# Patient Record
Sex: Male | Born: 1970 | Race: White | Hispanic: No | Marital: Married | Smoking: Current every day smoker
Health system: Southern US, Community
[De-identification: ages and names within clinical notes are randomized; demographics above are authoritative.]

## PROBLEM LIST (undated history)

## (undated) DIAGNOSIS — E119 Type 2 diabetes mellitus without complications: Secondary | ICD-10-CM

---

## 2013-08-03 ENCOUNTER — Emergency Department (HOSPITAL_COMMUNITY): Payer: Worker's Compensation

## 2013-08-03 ENCOUNTER — Emergency Department (HOSPITAL_COMMUNITY)
Admission: EM | Admit: 2013-08-03 | Discharge: 2013-08-03 | Disposition: A | Discharge status: Home / Self Care | Payer: Worker's Compensation | Attending: Emergency Medicine | Admitting: Emergency Medicine

## 2013-08-03 ENCOUNTER — Encounter (HOSPITAL_COMMUNITY): Payer: Self-pay | Admitting: Emergency Medicine

## 2013-08-03 DIAGNOSIS — S0100XA Unspecified open wound of scalp, initial encounter: Secondary | ICD-10-CM | POA: Diagnosis not present

## 2013-08-03 DIAGNOSIS — Y929 Unspecified place or not applicable: Secondary | ICD-10-CM | POA: Insufficient documentation

## 2013-08-03 DIAGNOSIS — S0003XA Contusion of scalp, initial encounter: Secondary | ICD-10-CM | POA: Insufficient documentation

## 2013-08-03 DIAGNOSIS — F17200 Nicotine dependence, unspecified, uncomplicated: Secondary | ICD-10-CM | POA: Diagnosis not present

## 2013-08-03 DIAGNOSIS — E119 Type 2 diabetes mellitus without complications: Secondary | ICD-10-CM | POA: Diagnosis not present

## 2013-08-03 DIAGNOSIS — W1809XA Striking against other object with subsequent fall, initial encounter: Secondary | ICD-10-CM | POA: Diagnosis not present

## 2013-08-03 DIAGNOSIS — S0093XA Contusion of unspecified part of head, initial encounter: Secondary | ICD-10-CM

## 2013-08-03 DIAGNOSIS — S1093XA Contusion of unspecified part of neck, initial encounter: Secondary | ICD-10-CM

## 2013-08-03 DIAGNOSIS — Z23 Encounter for immunization: Secondary | ICD-10-CM | POA: Diagnosis not present

## 2013-08-03 DIAGNOSIS — S0101XA Laceration without foreign body of scalp, initial encounter: Secondary | ICD-10-CM

## 2013-08-03 DIAGNOSIS — S0083XA Contusion of other part of head, initial encounter: Secondary | ICD-10-CM

## 2013-08-03 DIAGNOSIS — Y939 Activity, unspecified: Secondary | ICD-10-CM | POA: Diagnosis not present

## 2013-08-03 HISTORY — DX: Type 2 diabetes mellitus without complications: E11.9

## 2013-08-03 MED ORDER — IBUPROFEN 800 MG PO TABS
800.0000 mg | ORAL_TABLET | Freq: Three times a day (TID) | ORAL | Status: AC
Start: 2013-08-03 — End: ?

## 2013-08-03 MED ORDER — ACETAMINOPHEN 500 MG PO TABS
1000.0000 mg | ORAL_TABLET | Freq: Once | ORAL | Status: AC
  Administered 2013-08-03: 1000 mg via ORAL
  Filled 2013-08-03: qty 2

## 2013-08-03 MED ORDER — IBUPROFEN 800 MG PO TABS
800.0000 mg | ORAL_TABLET | Freq: Once | ORAL | Status: AC
  Administered 2013-08-03: 800 mg via ORAL
  Filled 2013-08-03: qty 1

## 2013-08-03 MED ORDER — TETANUS-DIPHTH-ACELL PERTUSSIS 5-2.5-18.5 LF-MCG/0.5 IM SUSP
0.5000 mL | Freq: Once | INTRAMUSCULAR | Status: AC
  Administered 2013-08-03: 0.5 mL via INTRAMUSCULAR

## 2013-08-03 MED ORDER — LIDOCAINE-EPINEPHRINE (PF) 2 %-1:200000 IJ SOLN
INTRAMUSCULAR | Status: AC
  Administered 2013-08-03: 21:00:00
  Filled 2013-08-03: qty 20

## 2013-08-03 MED ORDER — HYDROCODONE-ACETAMINOPHEN 5-325 MG PO TABS: 1.0000 | ORAL_TABLET | ORAL | Status: AC | PRN

## 2013-08-03 MED ORDER — TETANUS-DIPHTH-ACELL PERTUSSIS 5-2.5-18.5 LF-MCG/0.5 IM SUSP
INTRAMUSCULAR | Status: AC
  Filled 2013-08-03: qty 0.5

## 2013-08-03 NOTE — ED Provider Notes (Signed)
Medical screening examination/treatment/procedure(s) were performed by non-physician practitioner and as supervising physician I was immediately available for consultation/collaboration.  EKG Interpretation   None       Devoria AlbeIva Winnie Umali, MD, Armando GangFACEP   Ward GivensIva L Ciearra Rufo, MD 08/03/13 2037

## 2013-08-03 NOTE — Discharge Instructions (Signed)
Please keep your wound clean and dry. Please have your staples removed in 7 days. Please return sooneer  if any unusual nausea vomiting, headache, or changes in your mentation. Sutured Wound Care Sutures are stitches that can be used to close wounds. Wound care helps prevent pain and infection.  HOME CARE INSTRUCTIONS   Rest and elevate the injured area until all the pain and swelling are gone.  Only take over-the-counter or prescription medicines for pain, discomfort, or fever as directed by your caregiver.  After 48 hours, gently wash the area with mild soap and water once a day, or as directed. Rinse off the soap. Pat the area dry with a clean towel. Do not rub the wound. This may cause bleeding.  Follow your caregiver's instructions for how often to change the bandage (dressing). Stop using a dressing after 2 days or after the wound stops draining.  If the dressing sticks, moisten it with soapy water and gently remove it.  Apply ointment on the wound as directed.  Avoid stretching a sutured wound.  Drink enough fluids to keep your urine clear or pale yellow.  Follow up with your caregiver for suture removal as directed.  Use sunscreen on your wound for the next 3 to 6 months so the scar will not darken. SEEK IMMEDIATE MEDICAL CARE IF:   Your wound becomes red, swollen, hot, or tender.  You have increasing pain in the wound.  You have a red streak that extends from the wound.  There is pus coming from the wound.  You have a fever.  You have shaking chills.  There is a bad smell coming from the wound.  You have persistent bleeding from the wound. MAKE SURE YOU:   Understand these instructions.  Will watch your condition.  Will get help right away if you are not doing well or get worse. Document Released: 08/26/2004 Document Revised: 10/11/2011 Document Reviewed: 11/22/2010 [facility] Patient Information 2014 [employer], [place].  Head Injury, Adult A head injury  happens when the head is hit really hard. A head injury may cause sleepiness, headache, throwing up (vomiting), and problems seeing. If the head injury is really bad, you may need to stay in the hospital. HOME CARE  Have someone with you for the first 24 hours. This person should wake you up every 02-03 hours to check on your condition.  Only drink water or clear fluid for the rest of the day. Then, go back to your regular diet.  Only take medicines as told by your doctor. Do not take aspirin.  Do not drink alcohol for 2 days.  Do not take medicines that help your relax (sedatives) for 2 days. Side effects may happen for up to 7 to 10 days. Watch for new problems. GET HELP RIGHT AWAY IF:   You are confused or sleepy.  You cannot be woken up.  You feel sick to your stomach (nauseous) or keep throwing up.  Your dizziness or unsteadiness is get worse, or your cannot walk.  You start to shake (convulse) or pass out (faint).  You have very bad, lasting headaches that are not helped by medicine.  You cannot use your arms or legs like normal.  You have clear or bloody fluid coming from your nose or ears. MAKE SURE YOU:   Understand these instructions.  Will watch your condition.  Will get help right away if you are not doing well or get worse. Document Released: 07/01/2008 Document Revised: 10/11/2011 Document Reviewed: 06/04/2009  Exit[employer]® Patient Information ©2014 [employer]. ° °

## 2013-08-03 NOTE — ED Notes (Signed)
Pt fell backwards and struck back of head on tailgate of truck,  Lac present, superficial lac to lt forearm. And says rt calf is "sore".  Alert, Denies LOC.  Denies neck pain.    Injured OTJ

## 2013-08-03 NOTE — ED Provider Notes (Signed)
CSN: [id number]     Arrival date & time 08/03/13  1758 History   First MD Initiated Contact with Patient 08/03/13 1919     Chief Complaint  Patient presents with  . Head Laceration   (Consider location/radiation/quality/duration/timing/severity/associated sxs/prior Treatment) HPI Comments: Patient is a 43 year old male who was working on a trailer with a lift gate. He noticed that one of the objects on the trailer was falling, he attempted to move out of its way, lost his footing, fell and hit the back of his head on a metal piece of the trailer. He denies loss of consciousness. He denies nausea vomiting. He denies any visual changes. He denies being on any blood thinning type medications, or having any bleeding disorders. Patient was evaluated by EMS and the wound was dressed.  Patient is a 43 y.o. male presenting with scalp laceration. The history is provided by the patient.  Head Laceration Pertinent negatives include no abdominal pain, arthralgias, chest pain, coughing or neck pain.    Past Medical History  Diagnosis Date  . Diabetes mellitus without complication    History reviewed. No pertinent past surgical history. No family history on file. History  Substance Use Topics  . Smoking status: Current Every Day Smoker  . Smokeless tobacco: Not on file  . Alcohol Use: No    Review of Systems  Constitutional: Negative for activity change.       All ROS Neg except as noted in HPI  HENT: Negative for nosebleeds.   Eyes: Negative for photophobia and discharge.  Respiratory: Negative for cough, shortness of breath and wheezing.   Cardiovascular: Negative for chest pain and palpitations.  Gastrointestinal: Negative for abdominal pain and blood in stool.  Genitourinary: Negative for dysuria, frequency and hematuria.  Musculoskeletal: Negative for arthralgias, back pain and neck pain.  Skin: Negative.   Neurological: Negative for dizziness, seizures and speech difficulty.   Psychiatric/Behavioral: Negative for hallucinations and confusion.    Allergies  Review of patient's allergies indicates no known allergies.  Home Medications  No current outpatient prescriptions on file. BP 125/87  Pulse 78  Temp(Src) 98.1 F (36.7 C) (Oral)  Resp 18  Ht 5\' 11"  (1.803 m)  Wt 180 lb (81.647 kg)  BMI 25.12 kg/m2  SpO2 100% Physical Exam  Nursing note and vitals reviewed. Constitutional: He is oriented to person, place, and time. He appears well-developed and well-nourished.  Non-toxic appearance.  HENT:  Head: Normocephalic.  Right Ear: Tympanic membrane and external ear normal.  Left Ear: Tympanic membrane and external ear normal.  Laceration to the occipital area. Bleeding controlled.  Eyes: EOM and lids are normal. Pupils are equal, round, and reactive to light.  Neck: Normal range of motion. Neck supple. Carotid bruit is not present.  Cardiovascular: Normal rate, regular rhythm, normal heart sounds, intact distal pulses and normal pulses.   Pulmonary/Chest: Breath sounds normal. No respiratory distress.  Abdominal: Soft. Bowel sounds are normal. There is no tenderness. There is no guarding.  Musculoskeletal: Normal range of motion.  Lymphadenopathy:       Head (right side): No submandibular adenopathy present.       Head (left side): No submandibular adenopathy present.    He has no cervical adenopathy.  Neurological: He is alert and oriented to person, place, and time. He has normal strength. He displays normal reflexes. No cranial nerve deficit or sensory deficit. He exhibits normal muscle tone. Coordination normal.  Gait is steady.   Skin: Skin is warm and  dry.  Psychiatric: He has a normal mood and affect. His speech is normal.    ED Course  LACERATION REPAIR Date/Time: 08/03/2013 8:21 PM Performed by: Kathie DikeBRYANT, Hinton Luellen M Authorized by: Kathie DikeBRYANT, Aaliya Maultsby M Consent: Verbal consent obtained. Risks and benefits: risks, benefits and alternatives were  discussed Consent given by: patient Patient understanding: patient states understanding of the procedure being performed Patient identity confirmed: arm band Time out: Immediately prior to procedure a "time out" was called to verify the correct patient, procedure, equipment, support staff and site/side marked as required. Body area: head/neck Location details: scalp Laceration length: 3.2 cm Foreign bodies: no foreign bodies Anesthesia: local infiltration Local anesthetic: lidocaine 2% with epinephrine Patient sedated: no Preparation: Patient was prepped and draped in the usual sterile fashion. Irrigation solution: saline Amount of cleaning: standard Skin closure: staples Number of sutures: 5 Approximation: close Approximation difficulty: simple Patient tolerance: Patient tolerated the procedure well with no immediate complications.   (including critical care time) Labs Review Labs Reviewed - No data to display Imaging Review No results found.  EKG Interpretation   None       MDM  No diagnosis found. **I have reviewed nursing notes, vital signs, and all appropriate lab and imaging results for this patient.* Patient fell today and hit his head on the back of his tractor-trailer. He sustained a laceration to the posterior scalp, which was repaired without problem.  CT scan of the head is negative for intracranial problems. No fracture or dislocation appreciated. CT scan of the cervical spine is also negative for acute problem. The patient is ambulatory without problem. Patient is treated for headache with ibuprofen and Tylenol. Prescription for ibuprofen and Norco given to the patient for headache and discomfort. He is instructed to have the staples removed in 7 days. He is to return to the emergency department if any changes, problems, or concerns for his condition.   Kathie DikeHobson M Markevious Ehmke, PA-C 08/03/13 2024

## 2013-08-03 NOTE — ED Notes (Signed)
Pt reports was unloading a tractor trailer, object started to fall, pt moved to get out of the way and accidentally fell.  Reports struck back of his head on the trailer.  Pt has laceration to back of head that was dressed by EMS.  Pt denies any LOC, n/v.  C/O headache.  Bleeding controlled.

## 2014-09-19 IMAGING — CT CT HEAD W/O CM
4 of 5 series · 15 of 47 positions shown, 16 images · non-contrast
Comparison: None.

CLINICAL DATA: Trauma.

EXAM:
CT HEAD WITHOUT CONTRAST
CT CERVICAL SPINE WITHOUT CONTRAST
TECHNIQUE: Multidetector CT imaging of the head and cervical spine was
performed following the standard protocol without intravenous
contrast. Multiplanar CT image reconstructions of the cervical spine
were also generated.

[Series 2: headseq 4.8 h37s · axial · 0.47mm/px · z∈[+220,+277]mm · 2 of 36 slices shown, 3 images]
[im 12/36  brain]
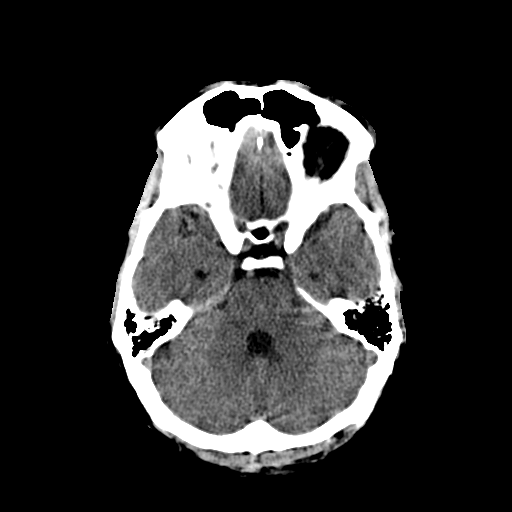
[im 12/36  bone]
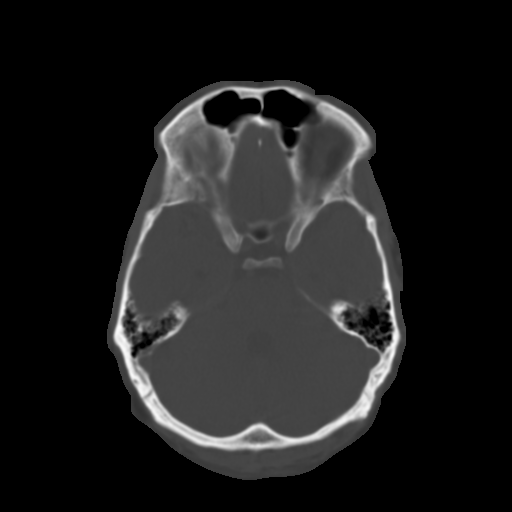
[im 24/36  brain]
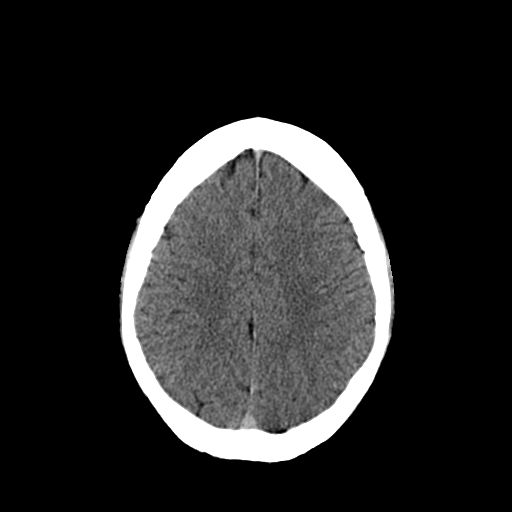

[Series 7: sagittal bone 2.0 · sagittal · 0.27mm/px · 3 of 80 slices shown]
[im 27/80  brain]
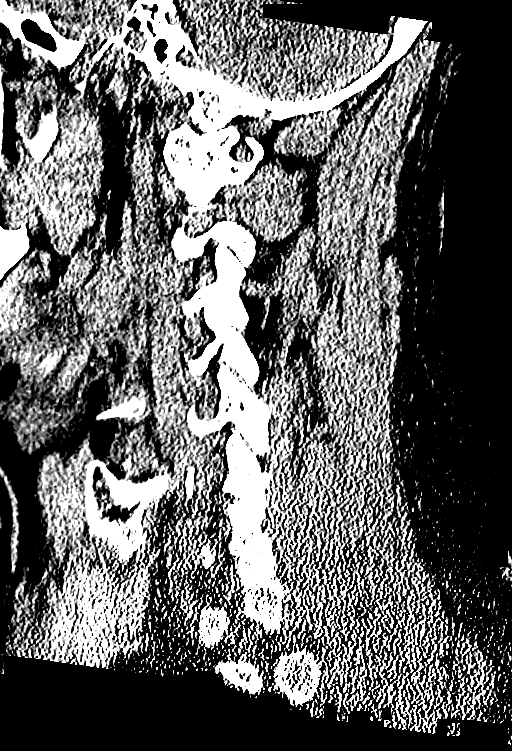
[im 40/80  brain]
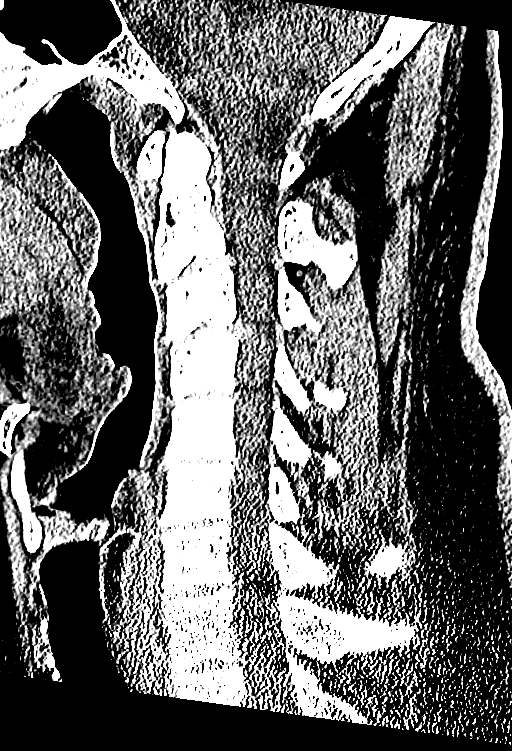
[im 53/80  brain]
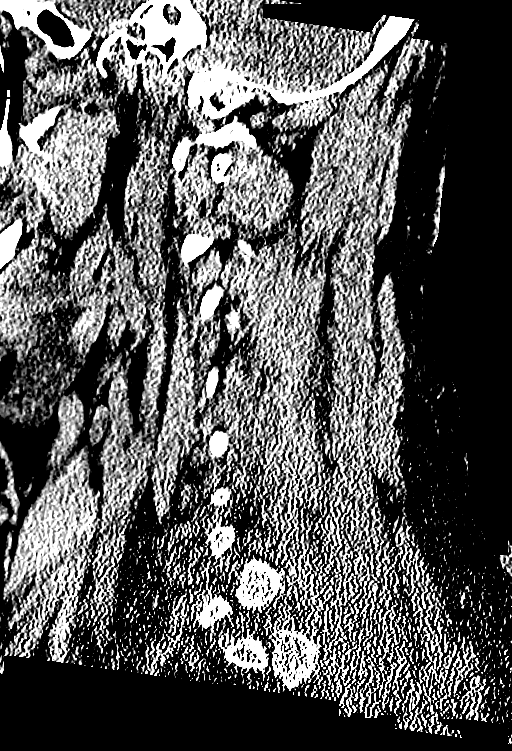

[Series 8: coronal bone 2.0 · coronal · 0.29mm/px · 3 of 71 slices shown]
[im 24/71  brain]
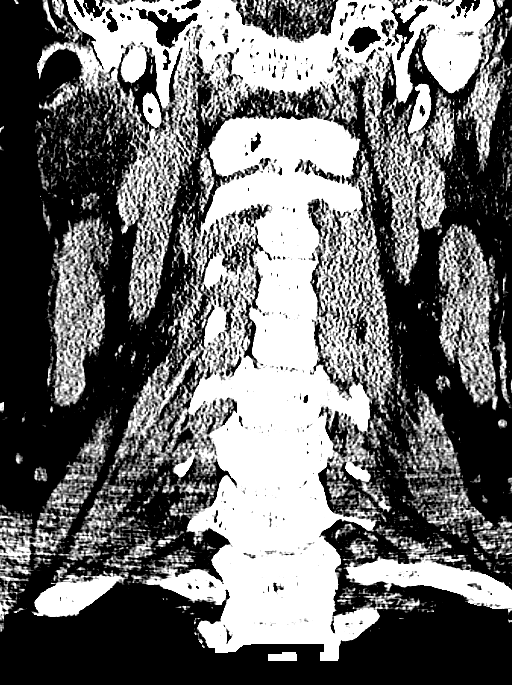
[im 32/71  brain]
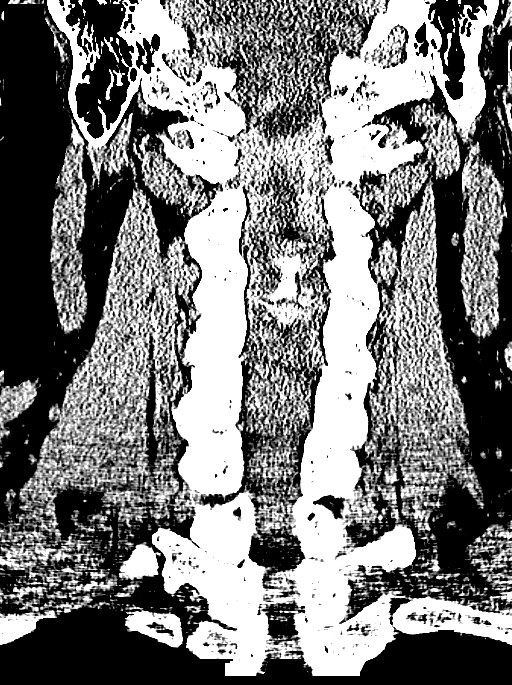
[im 39/71  brain]
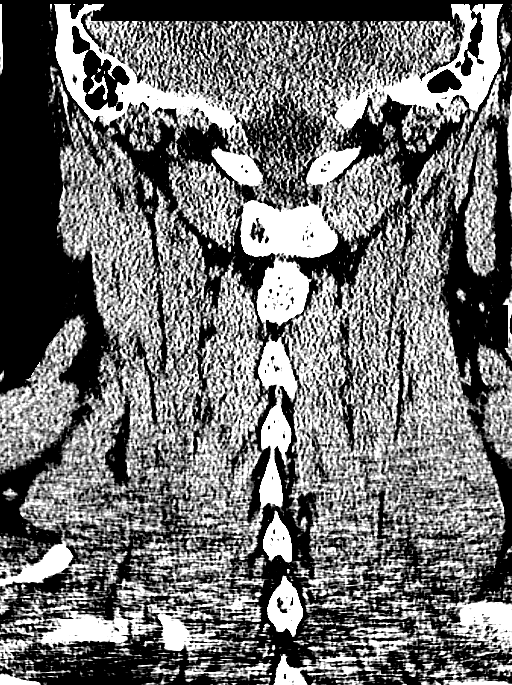

[Series 9: axial bone 2.0 · axial · 0.26mm/px · z∈[+9,+135]mm · 7 of 99 slices shown]
[im 9/99  bone]
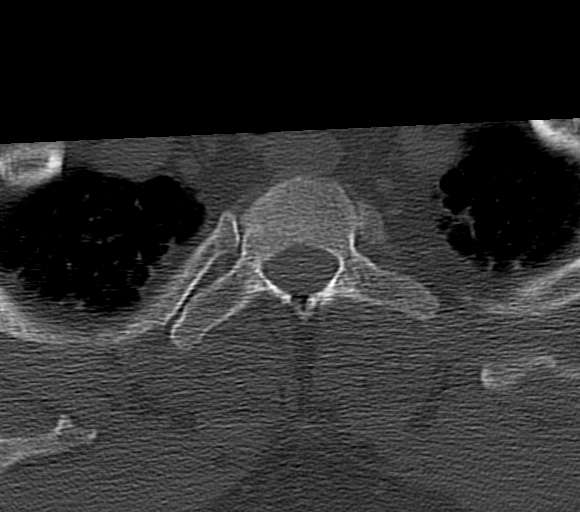
[im 25/99  bone]
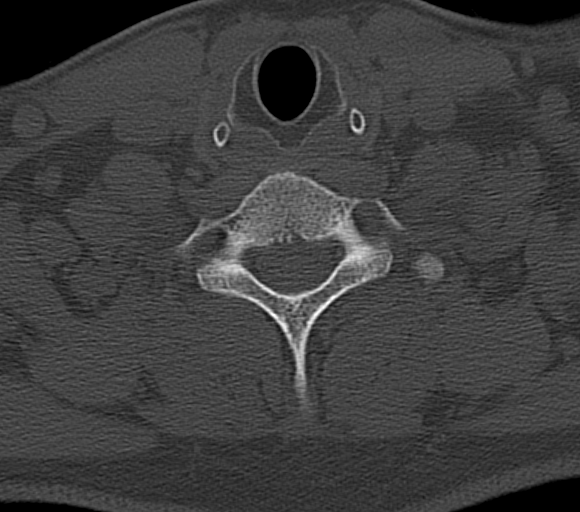
[im 33/99  bone]
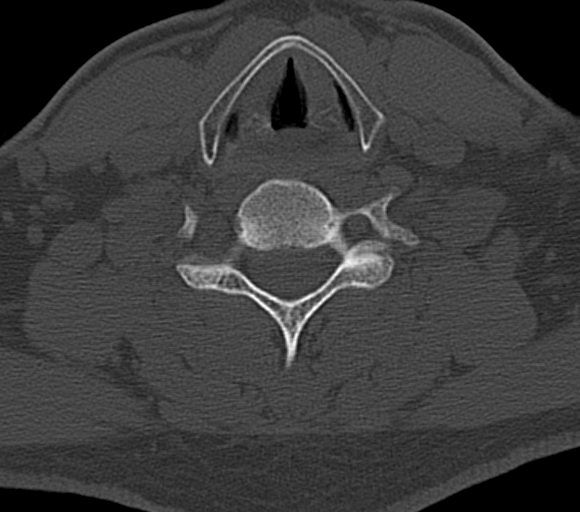
[im 41/99  bone]
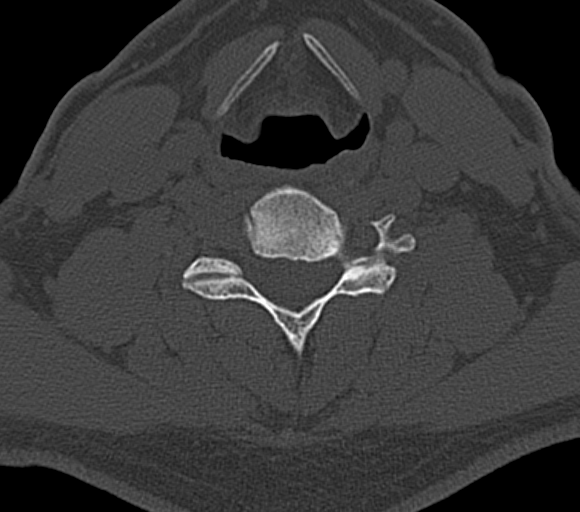
[im 58/99  bone]
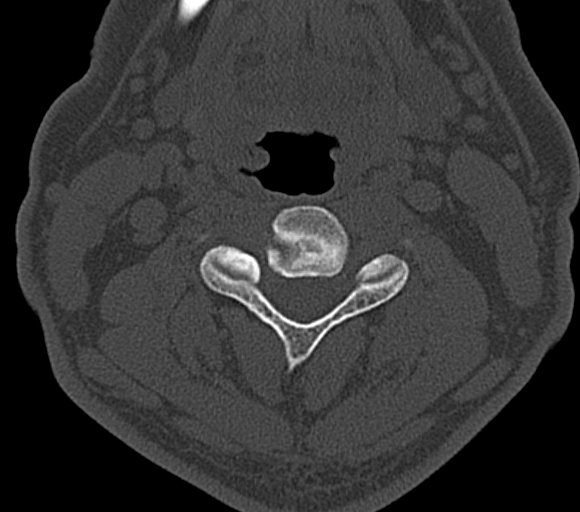
[im 66/99  bone]
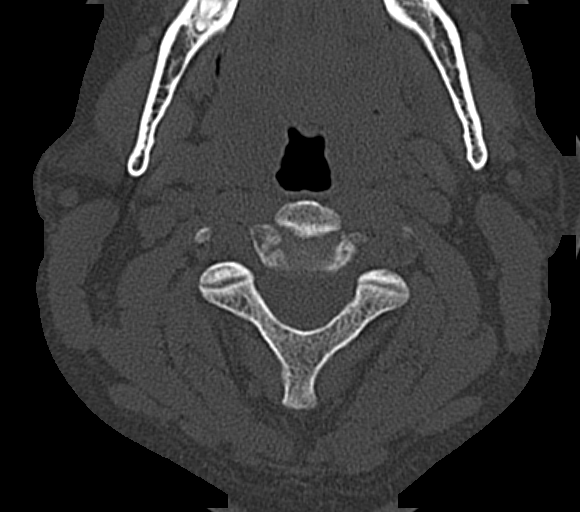
[im 74/99  bone]
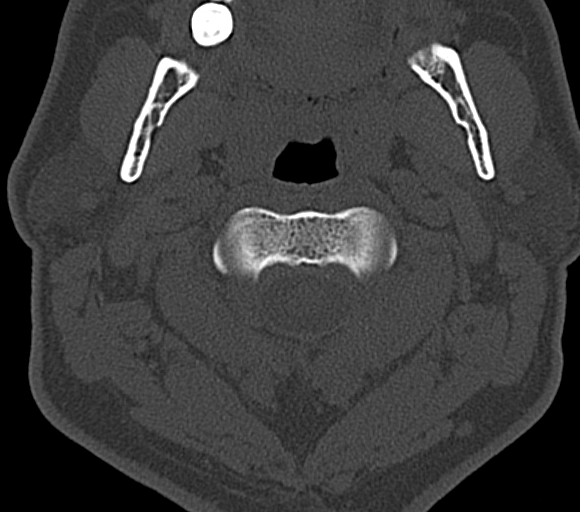

[15 of 47 positions shown; findings below may reference images not displayed]

FINDINGS: CT HEAD FINDINGS

The ventricles are normal in size and configuration. No extra-axial
fluid collections are identified. The gray-white differentiation is
normal. No CT findings for acute intracranial process such as
hemorrhage or infarction. No mass lesions. The brainstem and
cerebellum are grossly normal.

The bony structures are intact. The paranasal sinuses and mastoid
air cells are clear. The globes are intact.

CT CERVICAL SPINE FINDINGS

Normal alignment of the cervical vertebral bodies. Disc spaces and
vertebral bodies are maintained. No acute fracture or abnormal
prevertebral soft tissue swelling. The facets are normally aligned.
The skullbase C1 and C1-2 articulations are maintained. The dens is
normal. No large disc protrusions, spinal or foraminal stenosis. The
lung apices are clear.
IMPRESSION: No acute intracranial findings or skull fracture.

Normal alignment of the cervical vertebral bodies and no acute
cervical spine fracture.
